# Patient Record
Sex: Male | Born: 1975 | Race: White | Hispanic: No | Marital: Single | Smoking: Never smoker
Health system: Southern US, Community
[De-identification: ages and names within clinical notes are randomized; demographics above are authoritative.]

## PROBLEM LIST (undated history)

## (undated) DIAGNOSIS — G4730 Sleep apnea, unspecified: Secondary | ICD-10-CM

## (undated) HISTORY — DX: Sleep apnea, unspecified: G47.30

---

## 2012-05-07 ENCOUNTER — Ambulatory Visit
Admission: RE | Admit: 2012-05-07 | Discharge: 2012-05-07 | Disposition: A | Discharge status: Home / Self Care | Payer: No Typology Code available for payment source | Source: Ambulatory Visit | Attending: Chiropractic Medicine | Admitting: Chiropractic Medicine

## 2012-05-07 ENCOUNTER — Other Ambulatory Visit: Payer: Self-pay | Admitting: Chiropractic Medicine

## 2012-05-07 DIAGNOSIS — M542 Cervicalgia: Secondary | ICD-10-CM

## 2012-05-07 DIAGNOSIS — M5412 Radiculopathy, cervical region: Secondary | ICD-10-CM

## 2016-02-25 ENCOUNTER — Emergency Department (HOSPITAL_BASED_OUTPATIENT_CLINIC_OR_DEPARTMENT_OTHER): Payer: BLUE CROSS/BLUE SHIELD

## 2016-02-25 ENCOUNTER — Encounter (HOSPITAL_BASED_OUTPATIENT_CLINIC_OR_DEPARTMENT_OTHER): Payer: Self-pay | Admitting: *Deleted

## 2016-02-25 ENCOUNTER — Emergency Department (HOSPITAL_BASED_OUTPATIENT_CLINIC_OR_DEPARTMENT_OTHER)
Admission: EM | Admit: 2016-02-25 | Discharge: 2016-02-26 | Disposition: A | Discharge status: Home / Self Care | Payer: BLUE CROSS/BLUE SHIELD | Attending: Emergency Medicine | Admitting: Emergency Medicine

## 2016-02-25 DIAGNOSIS — S61214A Laceration without foreign body of right ring finger without damage to nail, initial encounter: Secondary | ICD-10-CM | POA: Insufficient documentation

## 2016-02-25 DIAGNOSIS — Y999 Unspecified external cause status: Secondary | ICD-10-CM | POA: Insufficient documentation

## 2016-02-25 DIAGNOSIS — Y929 Unspecified place or not applicable: Secondary | ICD-10-CM | POA: Diagnosis not present

## 2016-02-25 DIAGNOSIS — Z23 Encounter for immunization: Secondary | ICD-10-CM | POA: Diagnosis not present

## 2016-02-25 DIAGNOSIS — Y939 Activity, unspecified: Secondary | ICD-10-CM | POA: Diagnosis not present

## 2016-02-25 DIAGNOSIS — W260XXA Contact with knife, initial encounter: Secondary | ICD-10-CM | POA: Insufficient documentation

## 2016-02-25 DIAGNOSIS — S61219A Laceration without foreign body of unspecified finger without damage to nail, initial encounter: Secondary | ICD-10-CM

## 2016-02-25 MED ORDER — TETANUS-DIPHTH-ACELL PERTUSSIS 5-2.5-18.5 LF-MCG/0.5 IM SUSP
0.5000 mL | Freq: Once | INTRAMUSCULAR | Status: AC
  Administered 2016-02-25: 0.5 mL via INTRAMUSCULAR
  Filled 2016-02-25: qty 0.5

## 2016-02-25 NOTE — ED Provider Notes (Signed)
CSN: [id number]     Arrival date & time 02/25/16  2058 History   First MD Initiated Contact with Patient 02/25/16 2254     Chief Complaint  Patient presents with  . Laceration     (Consider location/radiation/quality/duration/timing/severity/associated sxs/prior Treatment) Patient is a 40 y.o. male presenting with skin laceration. The history is provided by the patient. No language interpreter was used.  Laceration Location:  Hand Hand laceration location:  R finger Depth:  Cutaneous Quality: avulsion   Bleeding: controlled   Time since incident:  5 hours Laceration mechanism:  Knife Foreign body present:  No foreign bodies Tetanus status:  Out of date   History reviewed. No pertinent past medical history. History reviewed. No pertinent past surgical history. No family history on file. Social History  Substance Use Topics  . Smoking status: Never Smoker   . Smokeless tobacco: None  . Alcohol Use: Yes    Review of Systems  Skin: Positive for wound.  All other systems reviewed and are negative.     Allergies  Review of patient's allergies indicates no known allergies.  Home Medications   Prior to Admission medications   Not on File   BP 143/101 mmHg  Pulse 64  Temp(Src) 98.3 F (36.8 C) (Oral)  Resp 18  Ht  (1.854 m)  Wt 97.523 kg  BMI 28.37 kg/m2  SpO2 100% Physical Exam  Constitutional: He is oriented to person, place, and time. He appears well-developed and well-nourished.  HENT:  Head: Normocephalic.  Eyes: Conjunctivae are normal.  Neck: Neck supple.  Cardiovascular: Normal rate and regular rhythm.   Pulmonary/Chest: Effort normal and breath sounds normal.  Abdominal: Soft.  Musculoskeletal: Normal range of motion.  Neurological: He is alert and oriented to person, place, and time.  Skin: Skin is warm and dry.  Psychiatric: He has a normal mood and affect.  Nursing note and vitals reviewed.   ED Course  Procedures (including critical  care time) LACERATION REPAIR Performed by: Jimmye Norman Authorized by: Jimmye Norman Consent: Verbal consent obtained. Risks and benefits: risks, benefits and alternatives were discussed Consent given by: patient Patient identity confirmed: provided demographic data   Laceration Location: right ring finger  Laceration Length: 0.5 cm, superficial flap avulsion  No Foreign Bodies seen or palpated  Amount of cleaning: standard  Skin closure: dermabond  Patient tolerance: Patient tolerated the procedure well with no immediate complications. Labs Review Labs Reviewed - No data to display  Imaging Review Dg Finger Ring Right  02/25/2016  CLINICAL DATA:  Laceration to the right ring finger while cleaning a knife. Initial encounter. EXAM: RIGHT RING FINGER 2+V COMPARISON:  None. FINDINGS: A soft tissue laceration is noted at the distal aspect of the fourth finger. No radiopaque foreign bodies are seen. There is no evidence of osseous disruption. An overlying dressing is noted. Visualized joint spaces are preserved. IMPRESSION: Soft tissue laceration at the distal aspect of the fourth finger. No evidence of osseous disruption. No radiopaque foreign bodies seen. Electronically Signed   By: Roanna Raider M.D.   On: 02/25/2016 21:45   I have personally reviewed and evaluated these images and lab results as part of my medical decision-making.   EKG Interpretation None      MDM   Final diagnoses:  None  Superficial flap avulsion of distal aspect of distal phalanx,  palmer surface of right ring finger.  Tetanus updated in ED. Laceration occurred < 12 hours prior to repair. Discussed laceration care  with pt and answered questions. Pt to return for wound check or follow-up with his PCP should there be signs of dehiscence or infection. Pt is hemodynamically stable with no complaints prior to dc.   Felicie Morn.     Analea Muller, NP 02/26/16 0102  Loren Raceravid Yelverton, MD 03/03/16 [phone]

## 2016-02-25 NOTE — ED Notes (Signed)
Suture cart to bedside. 

## 2016-02-25 NOTE — Discharge Instructions (Signed)
Tissue Adhesive Wound Care  ° °Some cuts, wounds, lacerations, and incisions can be repaired by using tissue adhesive. Tissue adhesive is like glue. It holds the skin together, allowing for faster healing. It forms a strong bond on the skin in about 1 minute and reaches its full strength in about 2 or 3 minutes. The adhesive disappears naturally while the wound is healing. It is important to take proper care of your wound at home while it heals.  °HOME CARE INSTRUCTIONS  °Showers are allowed. Do not soak the area containing the tissue adhesive. Do not take baths, swim, or use hot tubs. Do not use any soaps or ointments on the wound. Certain ointments can weaken the glue.  °If a bandage (dressing) has been applied, follow your health care provider's instructions for how often to change the dressing.  °Keep the dressing dry if one has been applied.  °Do not scratch, pick, or rub the adhesive.  °Do not place tape over the adhesive. The adhesive could come off when pulling the tape off.  °Protect the wound from further injury until it is healed.  °Protect the wound from sun and tanning bed exposure while it is healing and for several weeks after healing.  °Only take over-the-counter or prescription medicines as directed by your health care provider.  °Keep all follow-up appointments as directed by your health care provider. °SEEK IMMEDIATE MEDICAL CARE IF:  °Your wound becomes red, swollen, hot, or tender.  °You develop a rash after the glue is applied.  °You have increasing pain in the wound.  °You have a red streak that goes away from the wound.  °You have pus coming from the wound.  °You have increased bleeding.  °You have a fever.  °You have shaking chills.  °You notice a bad smell coming from the wound.  °Your wound or adhesive breaks open.  °MAKE SURE YOU:  °Understand these instructions.  °Will watch your condition.  °Will get help right away if you are not doing well or get worse. °This information is not  intended to replace advice given to you by your health care provider. Make sure you discuss any questions you have with your health care provider.  °Document Released: 04/15/2001 Document Revised: 08/10/2013 Document Reviewed: 05/11/2013  °Elsevier Interactive Patient Education ©2016 [employer]  ° °

## 2016-02-25 NOTE — ED Notes (Signed)
Wound now soaking in NS solution to clean congealed blood and further eval of wound.

## 2016-02-25 NOTE — ED Notes (Signed)
Laceration to his right 4th digit with chefs knife. Bleeding controlled.

## 2017-07-27 IMAGING — CR DG FINGER RING 2+V*R*
3 series · 3 of 3 positions shown · non-contrast
Comparison: None.

CLINICAL DATA: Laceration to the right ring finger while cleaning a
knife. Initial encounter.

EXAM:
RIGHT RING FINGER 2+V

[x finger pa right]
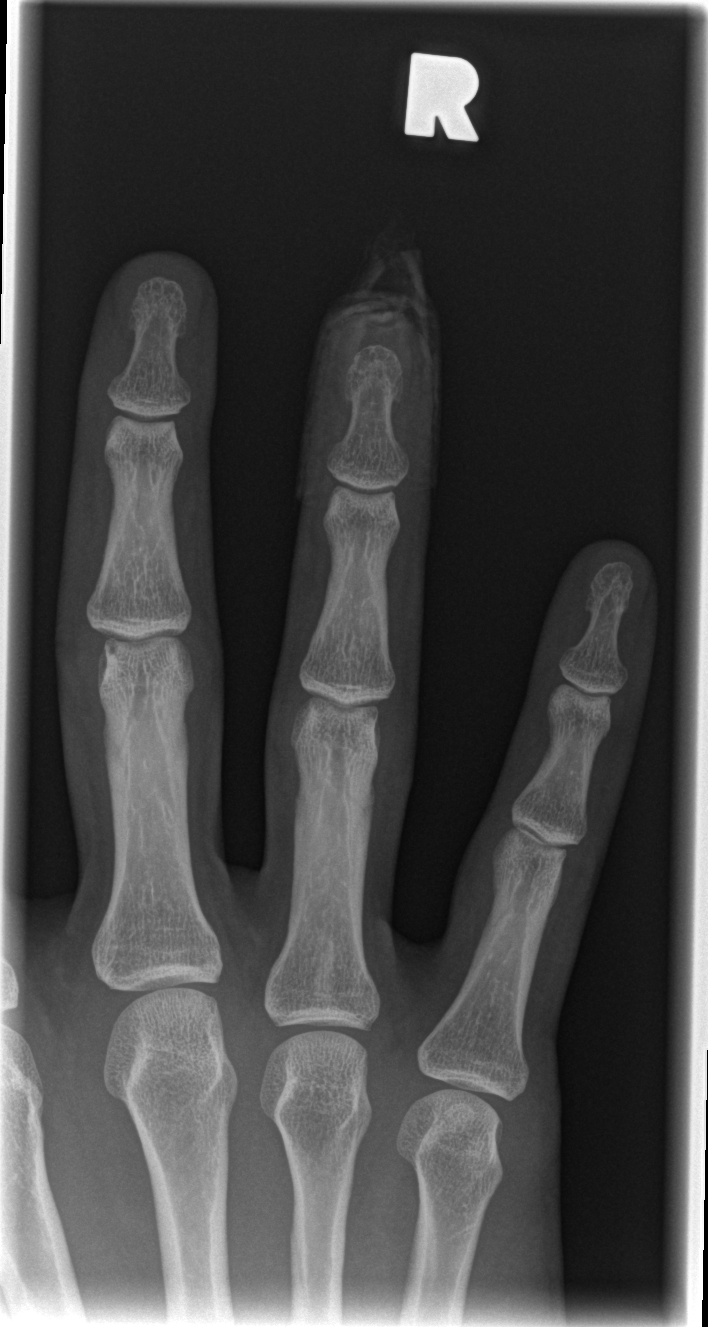

[x finger obl. right]
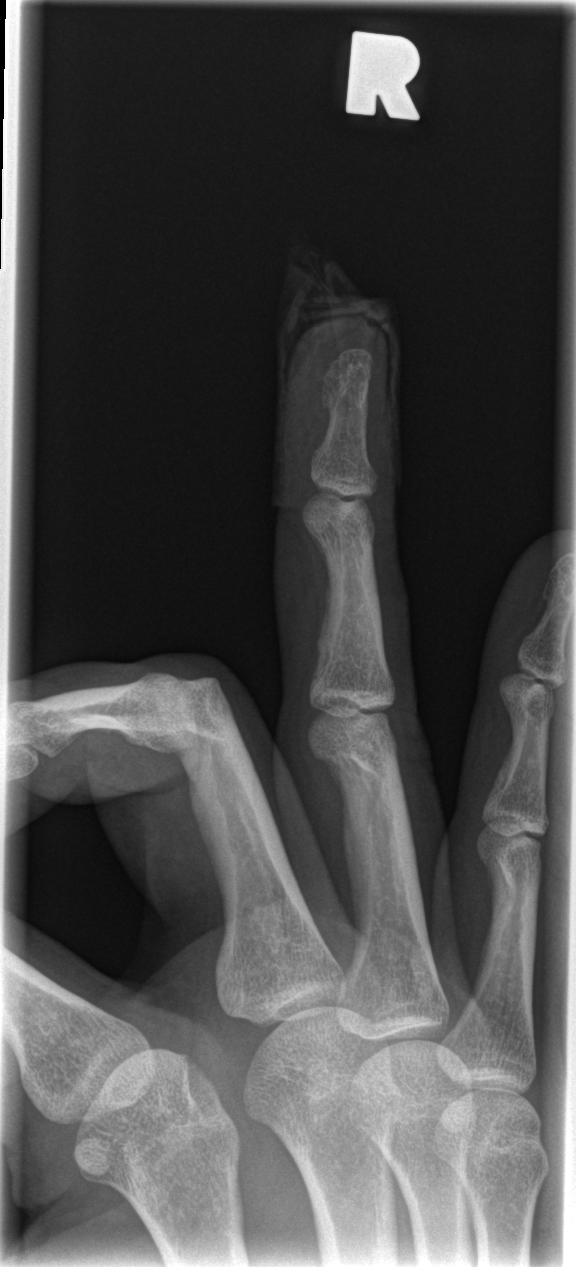

[x finger lateral right]
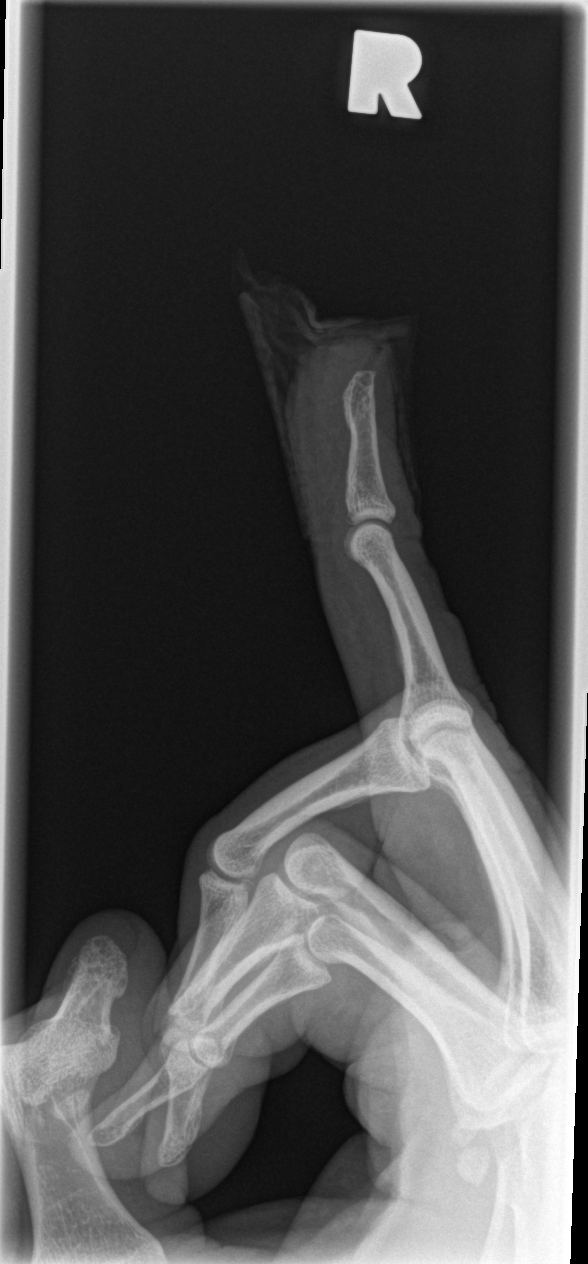

[3 of 3 positions shown; findings below may reference images not displayed]

FINDINGS: A soft tissue laceration is noted at the distal aspect of the fourth
finger. No radiopaque foreign bodies are seen. There is no evidence
of osseous disruption. An overlying dressing is noted.

Visualized joint spaces are preserved.
IMPRESSION: Soft tissue laceration at the distal aspect of the fourth finger. No
evidence of osseous disruption. No radiopaque foreign bodies seen.

## 2023-02-12 DIAGNOSIS — H6123 Impacted cerumen, bilateral: Secondary | ICD-10-CM | POA: Insufficient documentation

## 2023-02-16 ENCOUNTER — Ambulatory Visit (INDEPENDENT_AMBULATORY_CARE_PROVIDER_SITE_OTHER): Payer: BC Managed Care – PPO | Admitting: Dermatology

## 2023-02-16 ENCOUNTER — Encounter: Payer: Self-pay | Admitting: Dermatology

## 2023-02-16 VITALS — BP 130/86

## 2023-02-16 DIAGNOSIS — L814 Other melanin hyperpigmentation: Secondary | ICD-10-CM

## 2023-02-16 DIAGNOSIS — L820 Inflamed seborrheic keratosis: Secondary | ICD-10-CM

## 2023-02-16 DIAGNOSIS — D492 Neoplasm of unspecified behavior of bone, soft tissue, and skin: Secondary | ICD-10-CM

## 2023-02-16 NOTE — Progress Notes (Unsigned)
   New Patient Visit   Subjective  Jason Pruitt is a 47 y.o. male who presents for the following: Growth on the left cheek x 2 years. It has grown and changed color over 6 months. No personal history of skin cancer.  The following portions of the chart were reviewed this encounter and updated as appropriate: medications, allergies, medical history  Review of Systems:  No other skin or systemic complaints except as noted in HPI or Assessment and Plan.  Objective  Well appearing patient in no apparent distress; mood and affect are within normal limits.    A focused examination was performed of the following areas: face  Relevant exam findings are noted in the Assessment and Plan.  Left cheek 15mm brown verrucous papule         Assessment & Plan   LENTIGINES Exam: scattered tan macules on face Due to sun exposure Treatment Plan: Benign-appearing, observe. Recommend daily broad spectrum sunscreen SPF 30+ to sun-exposed areas, reapply every 2 hours as needed.  Call for any changes       Neoplasm of skin Left cheek  Skin / nail biopsy Type of biopsy: tangential   Informed consent: discussed and consent obtained   Timeout: patient name, date of birth, surgical site, and procedure verified   Procedure prep:  Patient was prepped and draped in usual sterile fashion Prep type:  Isopropyl alcohol Anesthesia: the lesion was anesthetized in a standard fashion   Anesthetic:  1% lidocaine w/ epinephrine 1-100,000 buffered w/ 8.4% NaHCO3 Instrument used: DermaBlade   Hemostasis achieved with: aluminum chloride   Outcome: patient tolerated procedure well   Post-procedure details: sterile dressing applied and wound care instructions given   Dressing type: petrolatum gauze and bandage      Return if symptoms worsen or fail to improve.  Jaclynn Guarneri, CMA, am acting as scribe for Langston Reusing, MD.   Documentation: I have reviewed the above documentation for  accuracy and completeness, and I agree with the above.  Langston Reusing, MD

## 2023-02-16 NOTE — Patient Instructions (Signed)
Due to recent changes in healthcare laws, you may see results of your pathology and/or laboratory studies on MyChart before the doctors have had a chance to review them. We understand that in some cases there may be results that are confusing or concerning to you. Please understand that not all results are received at the same time and often the doctors may need to interpret multiple results in order to provide you with the best plan of care or course of treatment. Therefore, we ask that you please give [place] 2 business days to thoroughly review all your results before contacting the office for clarification. Should we see a critical lab result, you will be contacted sooner.   If You Need Anything After Your Visit  If you have any questions or concerns for your doctor, please call our main line at [phone] If no one answers, please leave a voicemail as directed and we will return your call as soon as possible. Messages left after 4 pm will be answered the following business day.   You may also send [place] a message via [place]. We typically respond to MyChart messages within 1-2 business days.  For prescription refills, please ask your pharmacy to contact our office. Our fax number is [phone].  If you have an urgent issue when the clinic is closed that cannot wait until the next business day, you can page your doctor at the number below.    Please note that while we do our best to be available for urgent issues outside of office hours, we are not available 24/7.   If you have an urgent issue and are unable to reach [place], you may choose to seek medical care at your doctor's office, retail clinic, urgent care center, or emergency room.  If you have a medical emergency, please immediately call 911 or go to the emergency department. In the event of inclement weather, please call our main line at [phone] for an update on the status of any delays or closures.  Dermatology Medication Tips: Please  keep the boxes that topical medications come in in order to help keep track of the instructions about where and how to use these. Pharmacies typically print the medication instructions only on the boxes and not directly on the medication tubes.   If your medication is too expensive, please contact our office at [phone] or send [place] a message through [place].   We are unable to tell what your co-pay for medications will be in advance as this is different depending on your insurance coverage. However, we may be able to find a substitute medication at lower cost or fill out paperwork to get insurance to cover a needed medication.   If a prior authorization is required to get your medication covered by your insurance company, please allow [place] 1-2 business days to complete this process.  Drug prices often vary depending on where the prescription is filled and some pharmacies may offer cheaper prices.  The website www.goodrx.com contains coupons for medications through different pharmacies. The prices here do not account for what the cost may be with help from insurance (it may be cheaper with your insurance), but the website can give you the price if you did not use any insurance.  - You can print the associated coupon and take it with your prescription to the pharmacy.  - You may also stop by our office during regular business hours and pick up a GoodRx coupon card.  - If you need your  prescription sent electronically to a different pharmacy, notify our office through [facility] or by phone at [phone]    Patient Handout: Wound Care for Skin Biopsy Site  Patient Handout: Wound Care for Skin Biopsy Site  Taking Care of Your Skin Biopsy Site  Proper care of the biopsy site is essential for promoting healing and minimizing scarring. This handout provides instructions on how to care for your biopsy site to ensure optimal recovery.  1. Cleaning the Wound:  Clean the biopsy site daily  with gentle soap and water. Gently pat the area dry with a clean, soft towel. Avoid harsh scrubbing or rubbing the area, as this can irritate the skin and delay healing.  2. Applying Aquaphor and Bandage:  After cleaning the wound, apply a thin layer of Aquaphor ointment to the biopsy site. Cover the area with a sterile bandage to protect it from dirt, bacteria, and friction. Change the bandage daily or as needed if it becomes soiled or wet.  3. Continued Care for One Week:  Repeat the cleaning, Aquaphor application, and bandaging process daily for one week following the biopsy procedure. Keeping the wound clean and moist during this initial healing period will help prevent infection and promote optimal healing.  4. Massaging Aquaphor into the Area:  ---After one week, discontinue the use of bandages but continue to apply Aquaphor to the biopsy site. ----Gently massage the Aquaphor into the area using circular motions. ---Massaging the skin helps to promote circulation and prevent the formation of scar tissue.   Additional Tips:  Avoid exposing the biopsy site to direct sunlight during the healing process, as this can cause hyperpigmentation or worsen scarring. If you experience any signs of infection, such as increased redness, swelling, warmth, or drainage from the wound, contact your healthcare provider immediately. Follow any additional instructions provided by your healthcare provider for caring for the biopsy site and managing any discomfort. Conclusion:  Taking proper care of your skin biopsy site is crucial for ensuring optimal healing and minimizing scarring. By following these instructions for cleaning, applying Aquaphor, and massaging the area, you can promote a smooth and successful recovery. If you have any questions or concerns about caring for your biopsy site, don't hesitate to contact your healthcare provider for guidance.  Skin Education :   I counseled the patient  regarding the following: Sun screen (SPF 30 or greater) should be applied during peak UV exposure (between 10am and 2pm) and reapplied after exercise or swimming.

## 2023-02-17 NOTE — Progress Notes (Signed)
Hi Jason Pruitt  Dr. Onalee Hua reviewed your biopsy results and it showed the spot removed was benign (not cancerous).  No additional treatment is required.  The detailed report is available to view in MyChart.  Have a great day!  Kind Regards,  Dr. Kermit Balo Care Team
